# Patient Record
Sex: Male | Born: 1985 | Race: White | Hispanic: No | Marital: Single | Smoking: Former smoker
Health system: Southern US, Community
[De-identification: ages and names within clinical notes are randomized; demographics above are authoritative.]

## PROBLEM LIST (undated history)

## (undated) HISTORY — PX: FRACTURE SURGERY: SHX138

---

## 2014-07-26 ENCOUNTER — Encounter (HOSPITAL_COMMUNITY): Payer: Self-pay | Admitting: Emergency Medicine

## 2014-07-26 ENCOUNTER — Emergency Department (HOSPITAL_COMMUNITY)
Admission: EM | Admit: 2014-07-26 | Discharge: 2014-07-26 | Attending: Emergency Medicine | Admitting: Emergency Medicine

## 2014-07-26 ENCOUNTER — Emergency Department (HOSPITAL_COMMUNITY)

## 2014-07-26 DIAGNOSIS — Z87891 Personal history of nicotine dependence: Secondary | ICD-10-CM | POA: Insufficient documentation

## 2014-07-26 DIAGNOSIS — R319 Hematuria, unspecified: Secondary | ICD-10-CM | POA: Insufficient documentation

## 2014-07-26 DIAGNOSIS — R52 Pain, unspecified: Secondary | ICD-10-CM

## 2014-07-26 DIAGNOSIS — R300 Dysuria: Secondary | ICD-10-CM | POA: Diagnosis present

## 2014-07-26 LAB — CBC WITH DIFFERENTIAL/PLATELET
BASOS PCT: 0 % (ref 0–1)
Basophils Absolute: 0 10*3/uL (ref 0.0–0.1)
EOS ABS: 0.1 10*3/uL (ref 0.0–0.7)
Eosinophils Relative: 2 % (ref 0–5)
HEMATOCRIT: 44.4 % (ref 39.0–52.0)
HEMOGLOBIN: 15.9 g/dL (ref 13.0–17.0)
Lymphocytes Relative: 32 % (ref 12–46)
Lymphs Abs: 2.2 10*3/uL (ref 0.7–4.0)
MCH: 31 pg (ref 26.0–34.0)
MCHC: 35.8 g/dL (ref 30.0–36.0)
MCV: 86.5 fL (ref 78.0–100.0)
MONO ABS: 0.6 10*3/uL (ref 0.1–1.0)
MONOS PCT: 8 % (ref 3–12)
Neutro Abs: 3.9 10*3/uL (ref 1.7–7.7)
Neutrophils Relative %: 58 % (ref 43–77)
Platelets: 180 10*3/uL (ref 150–400)
RBC: 5.13 MIL/uL (ref 4.22–5.81)
RDW: 13.2 % (ref 11.5–15.5)
WBC: 6.8 10*3/uL (ref 4.0–10.5)

## 2014-07-26 LAB — BASIC METABOLIC PANEL
Anion gap: 12 (ref 5–15)
BUN: 11 mg/dL (ref 6–23)
CO2: 28 mEq/L (ref 19–32)
CREATININE: 0.84 mg/dL (ref 0.50–1.35)
Calcium: 9.6 mg/dL (ref 8.4–10.5)
Chloride: 99 mEq/L (ref 96–112)
Glucose, Bld: 100 mg/dL — ABNORMAL HIGH (ref 70–99)
Potassium: 4.2 mEq/L (ref 3.7–5.3)
Sodium: 139 mEq/L (ref 137–147)

## 2014-07-26 LAB — URINALYSIS, ROUTINE W REFLEX MICROSCOPIC
Bilirubin Urine: NEGATIVE
GLUCOSE, UA: NEGATIVE mg/dL
Ketones, ur: NEGATIVE mg/dL
Leukocytes, UA: NEGATIVE
Nitrite: NEGATIVE
PH: 7.5 (ref 5.0–8.0)
PROTEIN: NEGATIVE mg/dL
Specific Gravity, Urine: 1.02 (ref 1.005–1.030)
Urobilinogen, UA: 1 mg/dL (ref 0.0–1.0)

## 2014-07-26 LAB — URINE MICROSCOPIC-ADD ON

## 2014-07-26 MED ORDER — ONDANSETRON HCL 4 MG/2ML IJ SOLN
4.0000 mg | Freq: Once | INTRAMUSCULAR | Status: AC
  Administered 2014-07-26: 4 mg via INTRAVENOUS
  Filled 2014-07-26: qty 2

## 2014-07-26 MED ORDER — HYDROMORPHONE HCL 1 MG/ML IJ SOLN
1.0000 mg | Freq: Once | INTRAMUSCULAR | Status: AC
  Administered 2014-07-26: 1 mg via INTRAVENOUS
  Filled 2014-07-26: qty 1

## 2014-07-26 MED ORDER — IBUPROFEN 800 MG PO TABS: 800.0000 mg | ORAL_TABLET | Freq: Three times a day (TID) | ORAL | Status: AC | PRN

## 2014-07-26 MED ORDER — SODIUM CHLORIDE 0.9 % IV SOLN
INTRAVENOUS | Status: DC
Start: 2014-07-26 — End: 2014-07-26
  Administered 2014-07-26: 17:00:00 via INTRAVENOUS

## 2014-07-26 MED ORDER — KETOROLAC TROMETHAMINE 30 MG/ML IJ SOLN
30.0000 mg | Freq: Once | INTRAMUSCULAR | Status: AC
  Administered 2014-07-26: 30 mg via INTRAVENOUS
  Filled 2014-07-26: qty 1

## 2014-07-26 NOTE — ED Notes (Signed)
Discharge instructions given, pt demonstrated teach back and verbal understanding.Left in police custody. No concerns voiced.

## 2014-07-26 NOTE — ED Notes (Signed)
Pt c/o pain in left side with n/v and painful urination since this morning.

## 2014-07-26 NOTE — ED Provider Notes (Signed)
CSN: [id number]     Arrival date & time 07/26/14  1643 History   First MD Initiated Contact with Patient 07/26/14 1654     This chart was scribed for Adrian LennertJoseph L Zoriana Oats, MD by Arlan OrganAshley Delgado, ED Scribe. This patient was seen in room [facility]08/[facility]08 and the patient's care was started 4:57 PM.   Chief Complaint  Patient presents with  . Dysuria   Patient is a 28 y.o. male presenting with dysuria. The history is provided by the patient. No language interpreter was used.  Dysuria This is a new problem. The current episode started 2 days ago. The problem occurs constantly. The problem has not changed since onset.Associated symptoms include abdominal pain. Pertinent negatives include no chest pain and no headaches. Nothing aggravates the symptoms. Nothing relieves the symptoms. He has tried nothing for the symptoms.    HPI Comments: Adrian MesiJustin J Delgado is a 28 y.o. male  who presents to the Emergency Department complaining of constant, moderate dysuria x 1-2 days. Pt describes discomfort as burning and stinging. He also reports mild, constant LLQ abdominal pain. He has not tried any OTC medications or home remedies to help manage symptoms. No recent fever or chills. Mr. Adrian Delgado denies any penile pain. No known allergies to medications. No other concerns this visit.  History reviewed. No pertinent past medical history. Past Surgical History  Procedure Laterality Date  . Fracture surgery     History reviewed. No pertinent family history. History  Substance Use Topics  . Smoking status: Former Games developermoker  . Smokeless tobacco: Not on file  . Alcohol Use: No    Review of Systems  Constitutional: Negative for appetite change and fatigue.  HENT: Negative for congestion, ear discharge and sinus pressure.   Eyes: Negative for discharge.  Respiratory: Negative for cough.   Cardiovascular: Negative for chest pain.  Gastrointestinal: Positive for abdominal pain. Negative for diarrhea.  Genitourinary: Positive for  dysuria and flank pain. Negative for frequency and hematuria.  Musculoskeletal: Negative for back pain.  Skin: Negative for rash.  Neurological: Negative for seizures and headaches.  Psychiatric/Behavioral: Negative for hallucinations.      Allergies  Review of patient's allergies indicates no known allergies.  Home Medications   Prior to Admission medications   Not on File    Triage Vitals: BP 125/59  Pulse 74  Temp(Src) 98.9 F (37.2 C)  Resp 16  Ht 6\' 2"  (1.88 m)  Wt 205 lb (92.987 kg)  BMI 26.31 kg/m2  SpO2 100%   Physical Exam  Constitutional: He is oriented to person, place, and time. He appears well-developed.  HENT:  Head: Normocephalic.  Eyes: Conjunctivae and EOM are normal. No scleral icterus.  Neck: Neck supple. No thyromegaly present.  Cardiovascular: Normal rate and regular rhythm.  Exam reveals no gallop and no friction rub.   No murmur heard. Pulmonary/Chest: No stridor. He has no wheezes. He has no rales. He exhibits no tenderness.  Abdominal: He exhibits no distension. There is tenderness. There is no rebound.  Minor LLQ  Genitourinary:   L sided flank pain  Musculoskeletal: Normal range of motion. He exhibits no edema.  Lymphadenopathy:    He has no cervical adenopathy.  Neurological: He is oriented to person, place, and time. He exhibits normal muscle tone. Coordination normal.  Skin: No rash noted. No erythema.  Psychiatric: He has a normal mood and affect. His behavior is normal.    ED Course  Procedures (including critical care time)  DIAGNOSTIC STUDIES: Oxygen  Saturation is 100% on RA, Normal by my interpretation.    COORDINATION OF CARE: 5:09 PM- Will order urinalysis and CT abdomen pelvis without contrast, CBC, and BMP. Will give Toradol and Zofran. Discussed treatment plan with pt at bedside and pt agreed to plan.     Labs Review Labs Reviewed  URINALYSIS, ROUTINE W REFLEX MICROSCOPIC    Imaging Review No results found.    EKG Interpretation None      MDM   Final diagnoses:  None    Hematuria,     I personally performed the services described in this documentation, which was scribed in my presence. The recorded information has been reviewed and is accurate.    Adrian LennertJoseph L Kandiss Ihrig, MD 07/26/14 [id number]

## 2014-07-26 NOTE — ED Notes (Signed)
Pt asked for more pain medication prior to discharge, discussed this with EDP and was told pt could receive motrin prior to leaving. Pt refused and stated he would [facility] "[facility] the [facility]".

## 2014-07-26 NOTE — ED Notes (Signed)
C/O dysuria with trying to initiate urine stream and with urination.  Pain mostly in LLQ.  Reports mild burning and stinging w/urination. Has noticed hematuria. Has had n/v, syncope with the pain.  Denies penile d/c.  No hx of renal stones.

## 2014-07-26 NOTE — Discharge Instructions (Signed)
Follow up next week with your nurse or doctor to see if you still have blood in your urine

## 2016-02-16 IMAGING — CT CT RENAL STONE PROTOCOL
3 of 4 series · 8 of 46 positions shown, 15 images · non-contrast
Comparison: None.

CLINICAL DATA: Left-sided flank pain with microscopic hematuria

EXAM:
CT ABDOMEN AND PELVIS WITHOUT CONTRAST
TECHNIQUE: Multidetector CT imaging of the abdomen and pelvis was performed
following the standard protocol without IV contrast.

[Series 3: mpr coronal (id) · coronal · 0.71mm/px · 3 of 84 slices shown, 4 images]
[im 28/84  soft-tissue]
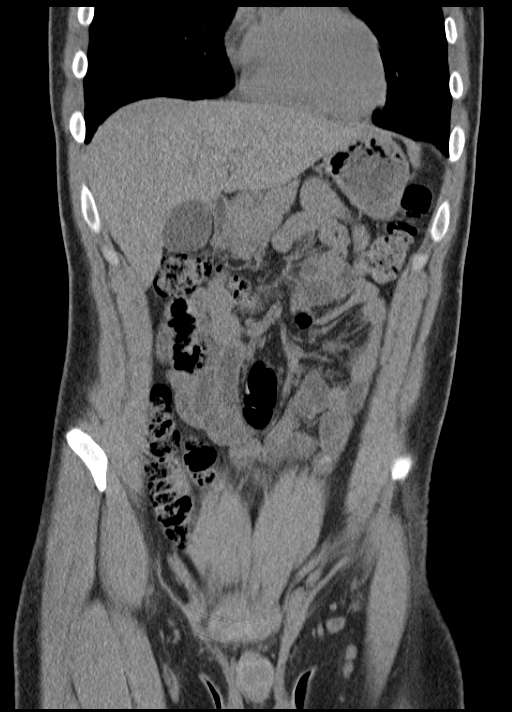
[im 37/84  soft-tissue]
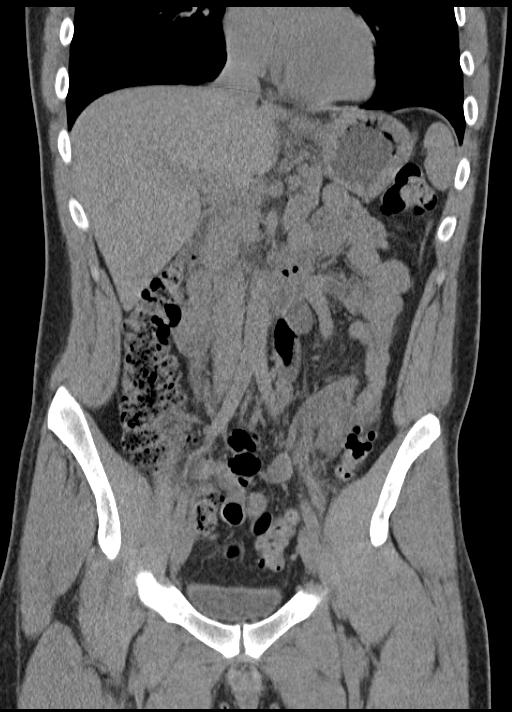
[im 37/84  bone]
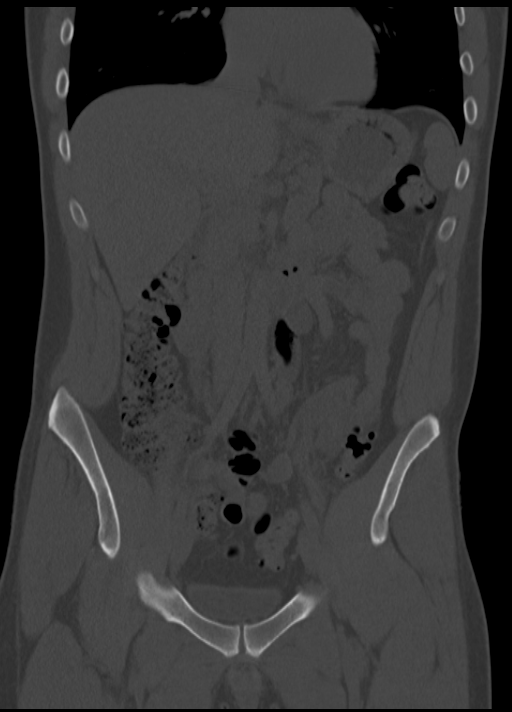
[im 47/84  soft-tissue]
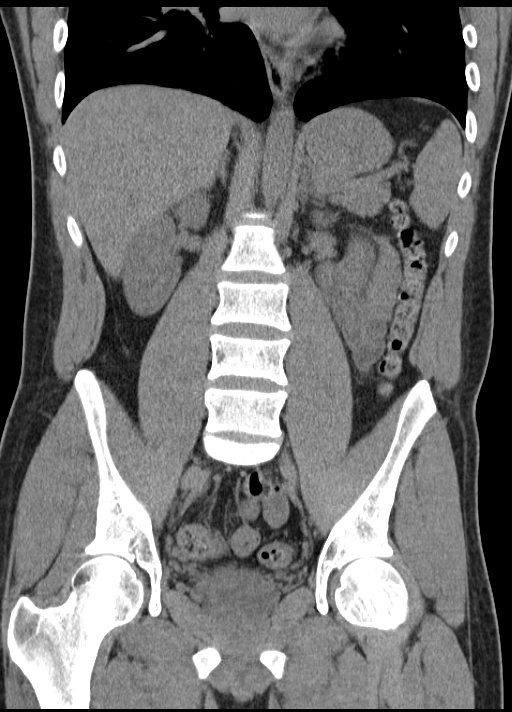

[Series 4: mpr sagittal (id) · sagittal · 0.54mm/px · 1 of 113 slices shown, 2 images]
[im 38/113  soft-tissue]
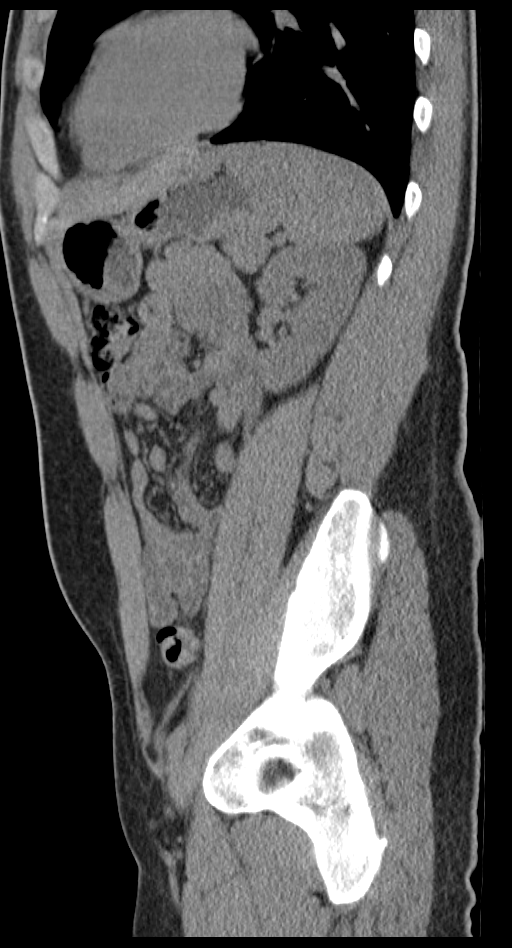
[im 38/113  bone]
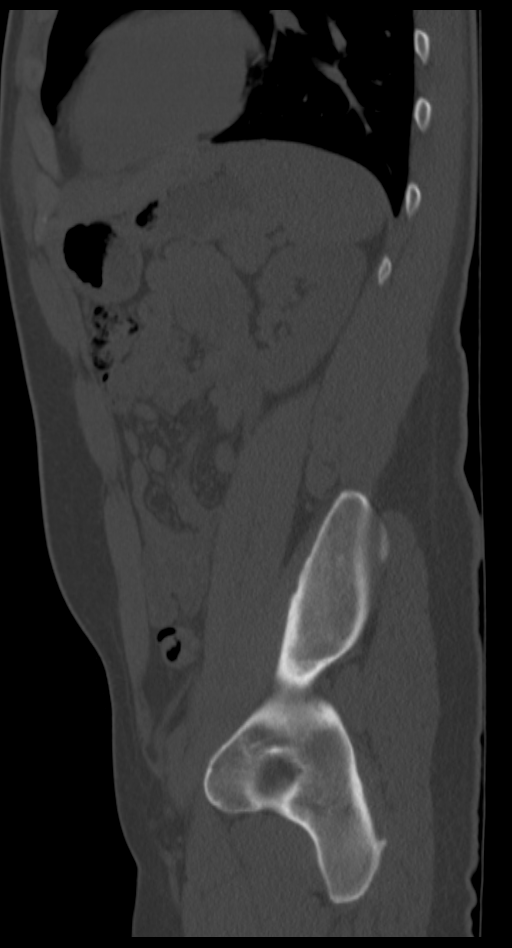

[Series 6: lung 5.0 b60f · axial · 0.63mm/px · z∈[-118,-53]mm · 4 of 23 slices shown, 9 images]
[im 5/23  soft-tissue]
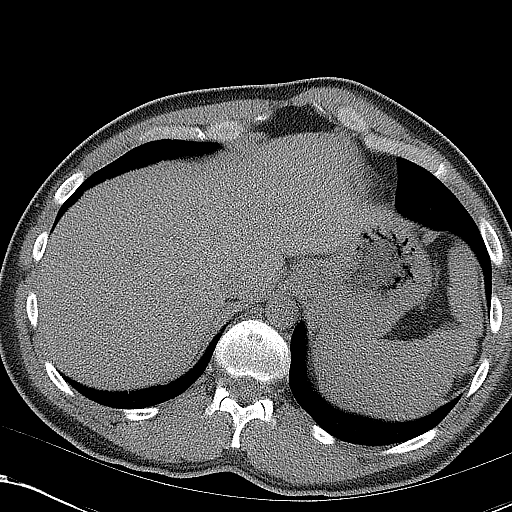
[im 5/23  lung]
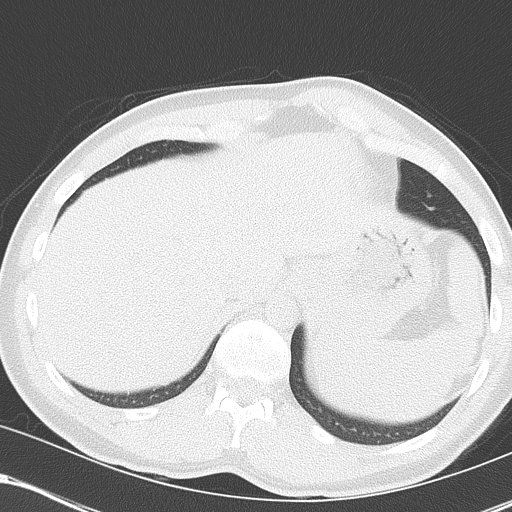
[im 5/23  bone]
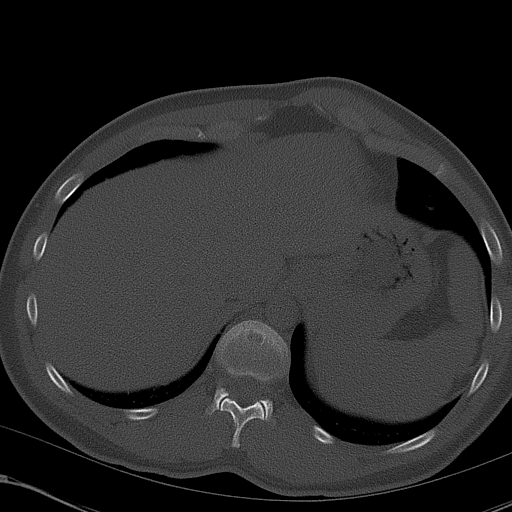
[im 9/23  soft-tissue]
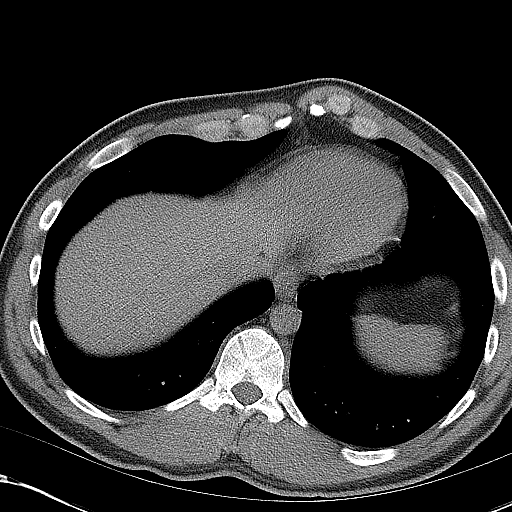
[im 9/23  lung]
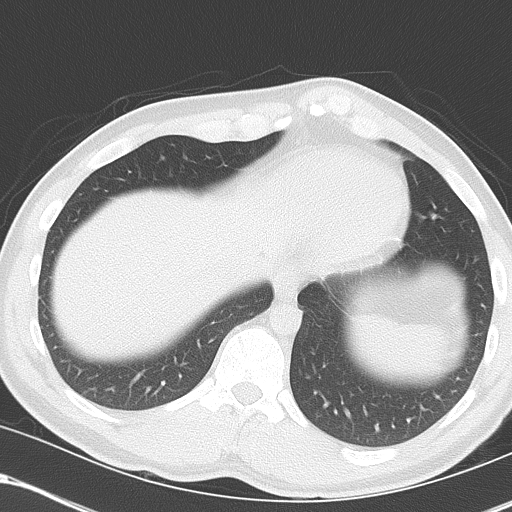
[im 14/23  soft-tissue]
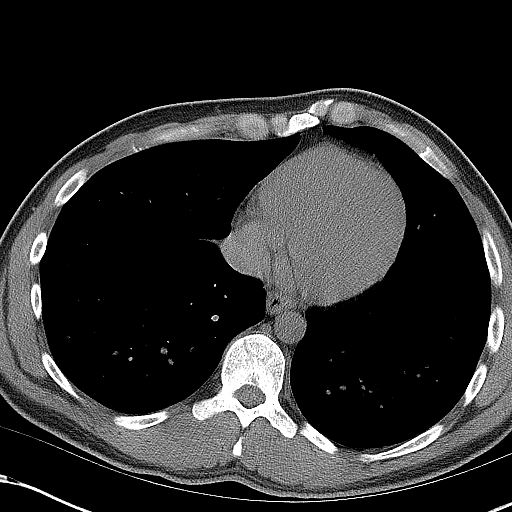
[im 14/23  lung]
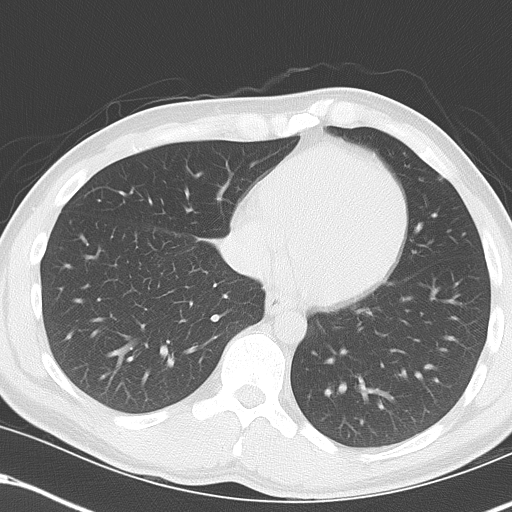
[im 18/23  soft-tissue]
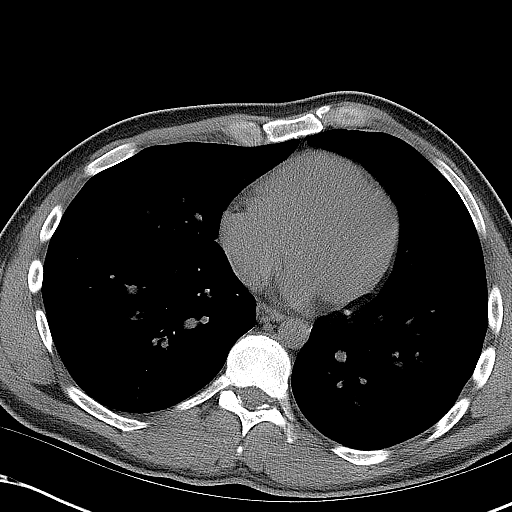
[im 18/23  lung]
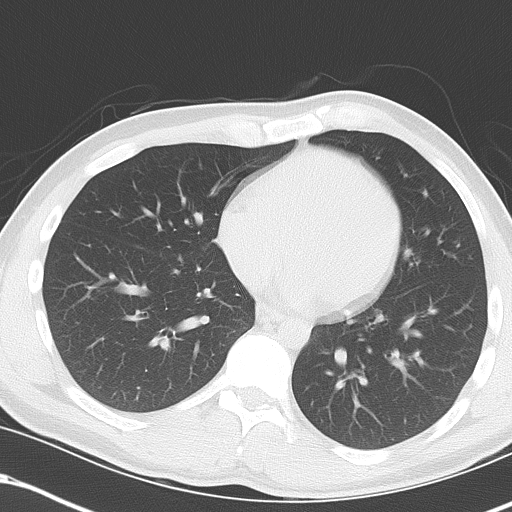

[8 of 46 positions shown; findings below may reference images not displayed]

FINDINGS: Lung bases are free of acute infiltrate or sizable effusion.

The liver, gallbladder, spleen, adrenal glands and pancreas are
within normal limits. The kidneys are well visualized bilaterally
and reveal no renal calculi or obstructive changes. No ureteral
abnormality is seen.

The appendix is partially visualized. No inflammatory changes are
seen. Bladder is decompressed. No pelvic mass lesion or sidewall
abnormality is noted. No bony abnormality is noted.
IMPRESSION: No acute abnormality seen.
